# Patient Record
Sex: Male | Born: 1995 | Race: Black or African American | Hispanic: No | Marital: Single | Smoking: Never smoker
Health system: Southern US, Community
[De-identification: ages and names within clinical notes are randomized; demographics above are authoritative.]

## PROBLEM LIST (undated history)

## (undated) DIAGNOSIS — G43909 Migraine, unspecified, not intractable, without status migrainosus: Secondary | ICD-10-CM

## (undated) DIAGNOSIS — A64 Unspecified sexually transmitted disease: Secondary | ICD-10-CM

---

## 2017-01-13 ENCOUNTER — Emergency Department (HOSPITAL_COMMUNITY)
Admission: EM | Admit: 2017-01-13 | Discharge: 2017-01-13 | Disposition: A | Discharge status: Home / Self Care | Payer: Self-pay | Attending: Emergency Medicine | Admitting: Emergency Medicine

## 2017-01-13 ENCOUNTER — Encounter (HOSPITAL_COMMUNITY): Payer: Self-pay | Admitting: Emergency Medicine

## 2017-01-13 DIAGNOSIS — Z202 Contact with and (suspected) exposure to infections with a predominantly sexual mode of transmission: Secondary | ICD-10-CM | POA: Insufficient documentation

## 2017-01-13 HISTORY — DX: Migraine, unspecified, not intractable, without status migrainosus: G43.909

## 2017-01-13 MED ORDER — LIDOCAINE HCL 1 % IJ SOLN
INTRAMUSCULAR | Status: AC
  Administered 2017-01-13: 20 mL
  Filled 2017-01-13: qty 20

## 2017-01-13 MED ORDER — CEFTRIAXONE SODIUM 250 MG IJ SOLR
250.0000 mg | Freq: Once | INTRAMUSCULAR | Status: AC
  Administered 2017-01-13: 250 mg via INTRAMUSCULAR
  Filled 2017-01-13: qty 250

## 2017-01-13 MED ORDER — AZITHROMYCIN 250 MG PO TABS
1000.0000 mg | ORAL_TABLET | Freq: Once | ORAL | Status: AC
  Administered 2017-01-13: 1000 mg via ORAL
  Filled 2017-01-13: qty 4

## 2017-01-13 NOTE — Discharge Instructions (Signed)
Your results will be available in 24-48 hours on MyChart and you will be called with any abnormal results Please do not have sex for the next two weeks and use a condom afterwards

## 2017-01-13 NOTE — ED Triage Notes (Signed)
Pt reports exposure to STD. Pt was informed yesterday. C/o pain on urination x 1 today

## 2017-01-13 NOTE — ED Provider Notes (Signed)
[facility]-EMERGENCY DEPT Provider Note   CSN: [id number] Arrival date & time: 01/13/17  1253  By signing my name below, I, Sonum Patel, attest that this documentation has been prepared under the direction and in the presence of [employer], PA-C. Electronically Signed: Sonum Patel, Neurosurgeon. 01/13/17. 1:56 PM.  History   Chief Complaint Chief Complaint  Patient presents with  . Exposure to STD   The history is provided by the patient. No language interpreter was used.     HPI Comments: Adam Paul is a 21 y.o. male who presents to the Emergency Department requesting STD testing today. He reports having intermittent dysuria yesterday but is unsure if he had any today. He states his last sexual encounter was 2 weeks ago and was unprotected. He states he believes his partner tested positive for something but does not have further information. He denies associated penile drainage, testicular pain.   Past Medical History:  Diagnosis Date  . Migraine     There are no active problems to display for this patient.   History reviewed. No pertinent surgical history.     Home Medications    Prior to Admission medications   Not on File    Family History Family History  Problem Relation Age of Onset  . Diabetes Father     Social History Social History  Substance Use Topics  . Smoking status: Never Smoker  . Smokeless tobacco: Never Used  . Alcohol use Yes     Comment: occ     Allergies   Patient has no known allergies.   Review of Systems Review of Systems  Constitutional: Negative for fever.  Genitourinary: Positive for dysuria. Negative for discharge and testicular pain.     Physical Exam Updated Vital Signs BP 138/69 (BP Location: Left Arm)   Pulse 88   Temp 98.1 F (36.7 C) (Oral)   Resp 18   Wt 238 lb (108 kg)   SpO2 96%   Physical Exam  Constitutional: He is oriented to person, place, and time. He appears well-developed and well-nourished.  HENT:  Head:  Normocephalic and atraumatic.  Cardiovascular: Normal rate.   Pulmonary/Chest: Effort normal.  Genitourinary:  Genitourinary Comments: No inguinal lymphadenopathy or inguinal hernia noted. Normal circumcised penis free of lesions or rash. Testicles are nontender with normal lie. Normal scrotal appearance. No obvious discharge noted. Chaperone present during exam.   Neurological: He is alert and oriented to person, place, and time.  Skin: Skin is warm and dry.  Psychiatric: He has a normal mood and affect.  Nursing note and vitals reviewed.    ED Treatments / Results  DIAGNOSTIC STUDIES: Oxygen Saturation is 96% on RA, adequate by my interpretation.    COORDINATION OF CARE: 1:54 PM Discussed treatment plan with pt at bedside and pt agreed to plan.   Labs (all labs ordered are listed, but only abnormal results are displayed) Labs Reviewed - No data to display  EKG  EKG Interpretation None       Radiology No results found.  Procedures Procedures (including critical care time)  Medications Ordered in ED Medications  cefTRIAXone (ROCEPHIN) injection 250 mg (250 mg Intramuscular Given 01/13/17 1420)  azithromycin (ZITHROMAX) tablet 1,000 mg (1,000 mg Oral Given 01/13/17 1420)  lidocaine (XYLOCAINE) 1 % (with pres) injection (20 mLs  Given 01/13/17 1423)     Initial Impression / Assessment and Plan / ED Course  I have reviewed the triage vital signs and the nursing notes.  Pertinent labs &  imaging results that were available during my care of the patient were reviewed by me and considered in my medical decision making (see chart for details).  Pt arrives for asymptomatic STD check. G&C culture sent. Due to exposure, will treated for G&C. Discussed safe sexual practices. Pt is advised to follow up for free testing at local health department in the future. Pt appears safe for discharge.    Final Clinical Impressions(s) / ED Diagnoses   Final diagnoses:  STD exposure    New  Prescriptions New Prescriptions   No medications on file   I personally performed the services described in this documentation, which was scribed in my presence. The recorded information has been reviewed and is accurate.    Bethel BornKelly Marie Asia Favata, PA-C 01/13/17 1753    Laurence Spatesachel Morgan Little, MD 01/15/17 2032

## 2017-01-14 LAB — GC/CHLAMYDIA PROBE AMP (~~LOC~~) NOT AT ARMC
CHLAMYDIA, DNA PROBE: NEGATIVE
Neisseria Gonorrhea: NEGATIVE

## 2017-02-06 ENCOUNTER — Encounter (HOSPITAL_COMMUNITY): Payer: Self-pay | Admitting: Emergency Medicine

## 2017-02-06 ENCOUNTER — Emergency Department (HOSPITAL_COMMUNITY): Payer: Self-pay

## 2017-02-06 ENCOUNTER — Emergency Department (HOSPITAL_COMMUNITY)
Admission: EM | Admit: 2017-02-06 | Discharge: 2017-02-06 | Disposition: A | Discharge status: Home / Self Care | Payer: Self-pay | Attending: Emergency Medicine | Admitting: Emergency Medicine

## 2017-02-06 DIAGNOSIS — Y9361 Activity, american tackle football: Secondary | ICD-10-CM | POA: Insufficient documentation

## 2017-02-06 DIAGNOSIS — W2101XA Struck by football, initial encounter: Secondary | ICD-10-CM | POA: Insufficient documentation

## 2017-02-06 DIAGNOSIS — Y999 Unspecified external cause status: Secondary | ICD-10-CM | POA: Insufficient documentation

## 2017-02-06 DIAGNOSIS — S4992XA Unspecified injury of left shoulder and upper arm, initial encounter: Secondary | ICD-10-CM | POA: Insufficient documentation

## 2017-02-06 DIAGNOSIS — W1830XA Fall on same level, unspecified, initial encounter: Secondary | ICD-10-CM | POA: Insufficient documentation

## 2017-02-06 DIAGNOSIS — Y929 Unspecified place or not applicable: Secondary | ICD-10-CM | POA: Insufficient documentation

## 2017-02-06 DIAGNOSIS — Z79899 Other long term (current) drug therapy: Secondary | ICD-10-CM | POA: Insufficient documentation

## 2017-02-06 HISTORY — DX: Unspecified sexually transmitted disease: A64

## 2017-02-06 MED ORDER — METHOCARBAMOL 500 MG PO TABS: 500.0000 mg | ORAL_TABLET | Freq: Two times a day (BID) | ORAL | 0 refills | Status: AC

## 2017-02-06 MED ORDER — KETOROLAC TROMETHAMINE 60 MG/2ML IM SOLN
60.0000 mg | Freq: Once | INTRAMUSCULAR | Status: AC
  Administered 2017-02-06: 60 mg via INTRAMUSCULAR
  Filled 2017-02-06: qty 2

## 2017-02-06 MED ORDER — IBUPROFEN 800 MG PO TABS: 800.0000 mg | ORAL_TABLET | Freq: Three times a day (TID) | ORAL | 0 refills | Status: AC

## 2017-02-06 NOTE — Discharge Instructions (Signed)
Your xray showed no acute abnormalities. May take ibuprofen or naproxen to reduce pain and inflammation. Robaxin is a muscle relaxer and can be used to loosen stiff or spasming muscles. Do not take Robaxin while driving or performing other dangerous activities. Use the sling as needed for comfort. Follow-up with the orthopedic surgeon, as planned, tomorrow.

## 2017-02-06 NOTE — ED Triage Notes (Signed)
Pt has pain in both shoulder since yesterday. Pt was in football practice , Pt reports that pain in l/shoulder hurts worse

## 2017-02-06 NOTE — ED Provider Notes (Signed)
[facility]-EMERGENCY DEPT Provider Note   CSN: [id number] Arrival date & time: 02/06/17  1149 By signing my name below, I, Levon HedgerElizabeth Hall, attest that this documentation has been prepared under the direction and in the presence of non-physician practitioner, Harolyn RutherfordShawn Joy, PA-C. Electronically Signed: Levon HedgerElizabeth Hall, Scribe. 02/06/2017. 1:13 PM.   History   Chief Complaint Chief Complaint  Patient presents with  . Shoulder Injury    l/shoulder   HPI Adam Paul is a 21 y.o. male who presents to the Emergency Department complaining of sudden onset, progressively worsening left shoulder pain onset yesterday s/p mechanical fall. Pt states he yesterday during football practice he collided with another player and noted shoulder pain after getting up.  He then applied ice to the area and took Tylenol with mild relief of pain. This morning, pt states his pain was severe. He currently describes his left shoulder pain as 10/10, constant, throbbing pain that is exacerbated by movement. He notes associated right shoulder pain which he states is mild to moderate in severity and states that it "just feels sore." Denies neuro deficit, neck/back pain, LOC, or any other complaints.  Patient has an orthopedic surgeon he has seen in the past. He sees Dr. Jenness CornerBoartang with [facility] in [place].  The history is provided by the patient. No language interpreter was used.   Past Medical History:  Diagnosis Date  . Migraine   . STD (male)    There are no active problems to display for this patient.   History reviewed. No pertinent surgical history.   Home Medications    Prior to Admission medications   Medication Sig Start Date End Date Taking? Authorizing Provider  ibuprofen (ADVIL,MOTRIN) 800 MG tablet Take 1 tablet (800 mg total) by mouth 3 (three) times daily. 02/06/17   Shawn C Joy, PA-C  methocarbamol (ROBAXIN) 500 MG tablet Take 1 tablet (500 mg total) by mouth 2 (two) times daily. 02/06/17   Anselm PancoastShawn C Joy, PA-C     Family History Family History  Problem Relation Age of Onset  . Diabetes Father     Social History Social History  Substance Use Topics  . Smoking status: Never Smoker  . Smokeless tobacco: Never Used  . Alcohol use Yes     Comment: occ    Allergies   Patient has no known allergies.   Review of Systems Review of Systems  Cardiovascular: Negative for chest pain.  Gastrointestinal: Negative for nausea and vomiting.  Musculoskeletal: Positive for arthralgias. Negative for joint swelling.  Neurological: Negative for weakness and numbness.   Physical Exam Updated Vital Signs BP 118/75 (BP Location: Right Arm)   Pulse 86   Temp 98.3 F (36.8 C) (Oral)   Resp 18   Wt 110.2 kg   SpO2 97%   Physical Exam  Constitutional: He appears well-developed and well-nourished. No distress.  HENT:  Head: Normocephalic and atraumatic.  Eyes: Conjunctivae are normal.  Neck: Neck supple.  Cardiovascular: Normal rate, regular rhythm and intact distal pulses.   Pulmonary/Chest: Effort normal.  Musculoskeletal: He exhibits tenderness.  Tenderness to anterior left shoulder. Limited active ROM to left shoulder due to pain. Passive ROM is more intact with abduction limited to 90 degrees. Empty can test elicits pain in anterior shoulder. No noted crepitus, instability, or deformity.  Neurological: He is alert.  Equal grip strengths, no sensory deficits. Strength in the bilateral upper extremities is 5/5.  Skin: Skin is warm and dry. Capillary refill takes less than 2 seconds. He is  not diaphoretic.  Psychiatric: He has a normal mood and affect. His behavior is normal.  Nursing note and vitals reviewed.  ED Treatments / Results  DIAGNOSTIC STUDIES:  Oxygen Saturation is 96% on RA, normal by my interpretation.    COORDINATION OF CARE:  1:10 PM Discussed treatment plan with pt at bedside and pt agreed to plan.   Labs (all labs ordered are listed, but only abnormal results are  displayed) Labs Reviewed - No data to display  EKG  EKG Interpretation None      Radiology Dg Shoulder Left  Result Date: 02/06/2017 CLINICAL DATA:  Acute left shoulder pain following football injury yesterday. Initial encounter. EXAM: LEFT SHOULDER - 2+ VIEW COMPARISON:  None. FINDINGS: No acute fracture or dislocation noted. No focal bony lesions are identified. No other abnormalities identified. IMPRESSION: No acute bony abnormality. Electronically Signed   By: Harmon Pier M.D.   On: 02/06/2017 13:58    Procedures Procedures (including critical care time)  Medications Ordered in ED Medications  ketorolac (TORADOL) injection 60 mg (60 mg Intramuscular Given 02/06/17 1314)     Initial Impression / Assessment and Plan / ED Course  I have reviewed the triage vital signs and the nursing notes.  Pertinent labs & imaging results that were available during my care of the patient were reviewed by me and considered in my medical decision making (see chart for details).      Patient presents with left shoulder injury. No acute abnormalities on x-ray. Before his xrays returned, patient tells me that he set up an appointment for tomorrow morning. Patient placed in a sling and instructed to keep his appointment tomorrow morning. Further home care and return precautions discussed. Patient voices understanding of all instructions and is comfortable with discharge.  Vitals:   02/06/17 1159 02/06/17 1201 02/06/17 1412  BP: 118/75    Pulse: 98  86  Resp: 18    Temp: 98.3 F (36.8 C)    TempSrc: Oral    SpO2: 96%  97%  Weight:  110.2 kg      Final Clinical Impressions(s) / ED Diagnoses   Final diagnoses:  Injury of left shoulder, initial encounter    New Prescriptions Discharge Medication List as of 02/06/2017  2:14 PM    START taking these medications   Details  ibuprofen (ADVIL,MOTRIN) 800 MG tablet Take 1 tablet (800 mg total) by mouth 3 (three) times daily., Starting Sun  02/06/2017, Print    methocarbamol (ROBAXIN) 500 MG tablet Take 1 tablet (500 mg total) by mouth 2 (two) times daily., Starting Sun 02/06/2017, Print       I personally performed the services described in this documentation, which was scribed in my presence. The recorded information has been reviewed and is accurate.    Anselm Pancoast, PA-C 02/07/17 1845    Charlynne Pander, MD 02/09/17 [phone]

## 2018-09-11 IMAGING — DX DG SHOULDER 2+V*L*
2 series · 2 of 2 positions shown · non-contrast
Comparison: None.

CLINICAL DATA: Acute left shoulder pain following football injury
yesterday. Initial encounter.

EXAM:
LEFT SHOULDER - 2+ VIEW

[shoulder ap]
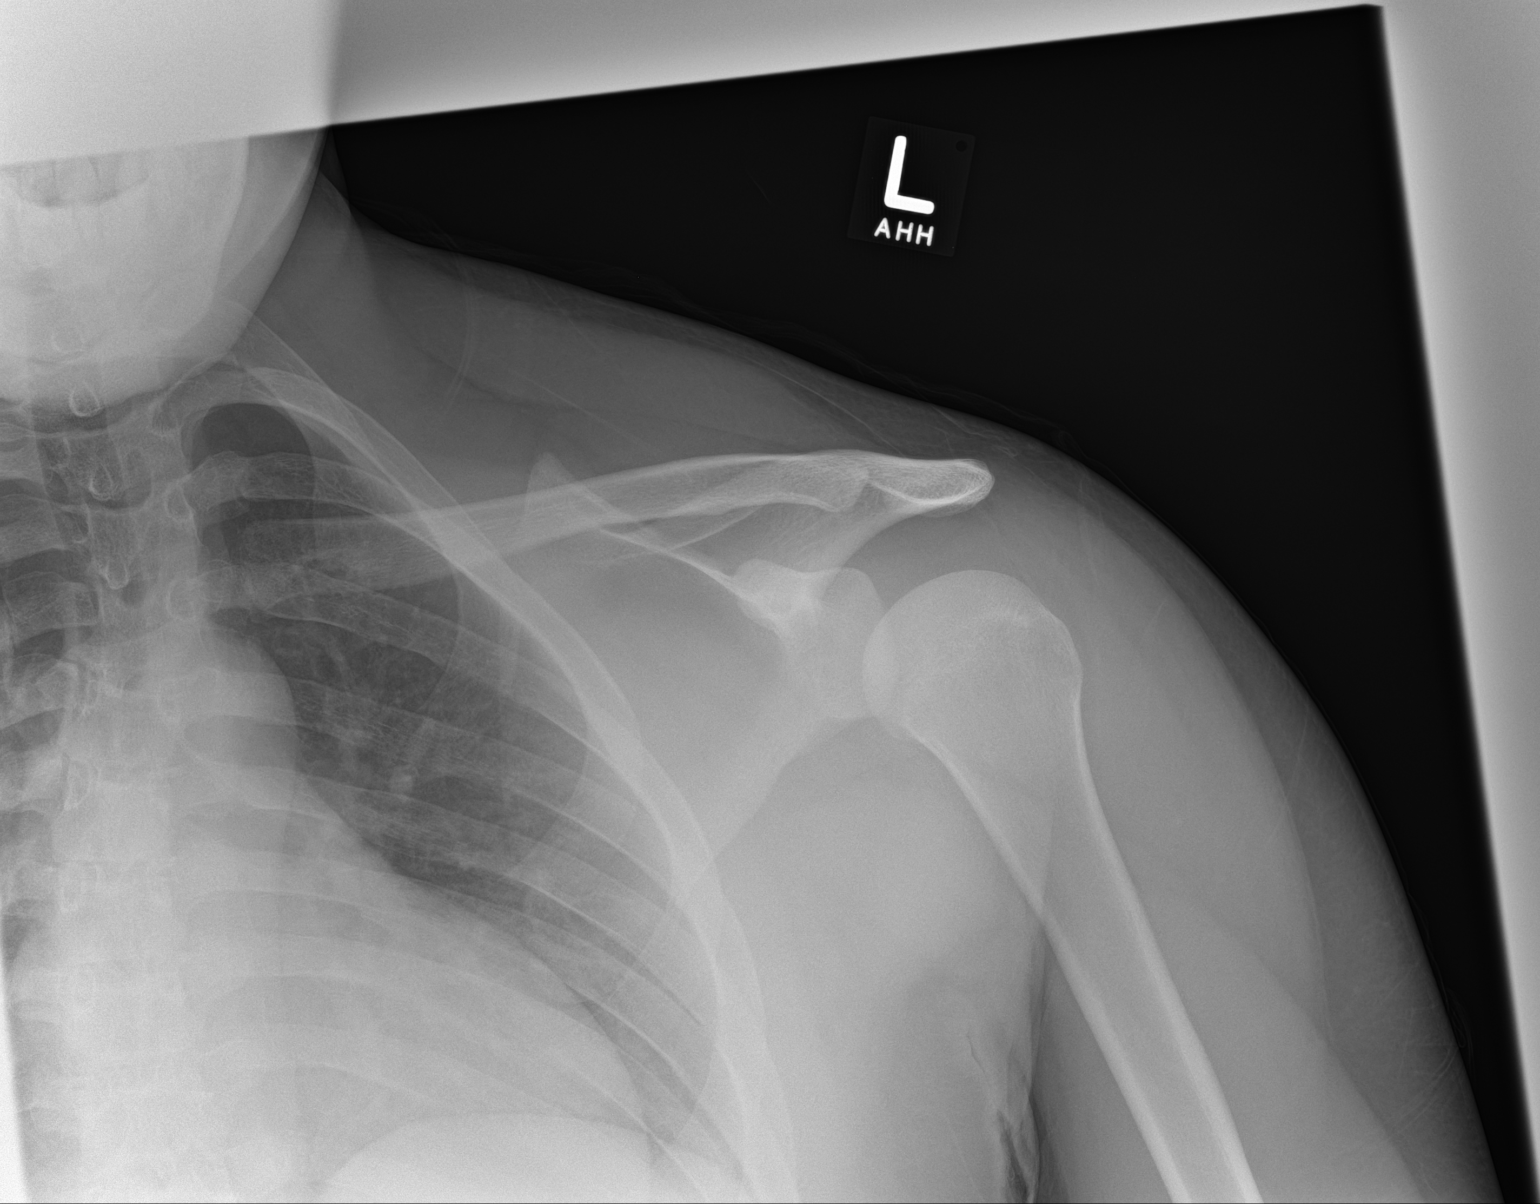

[shoulder y-view]
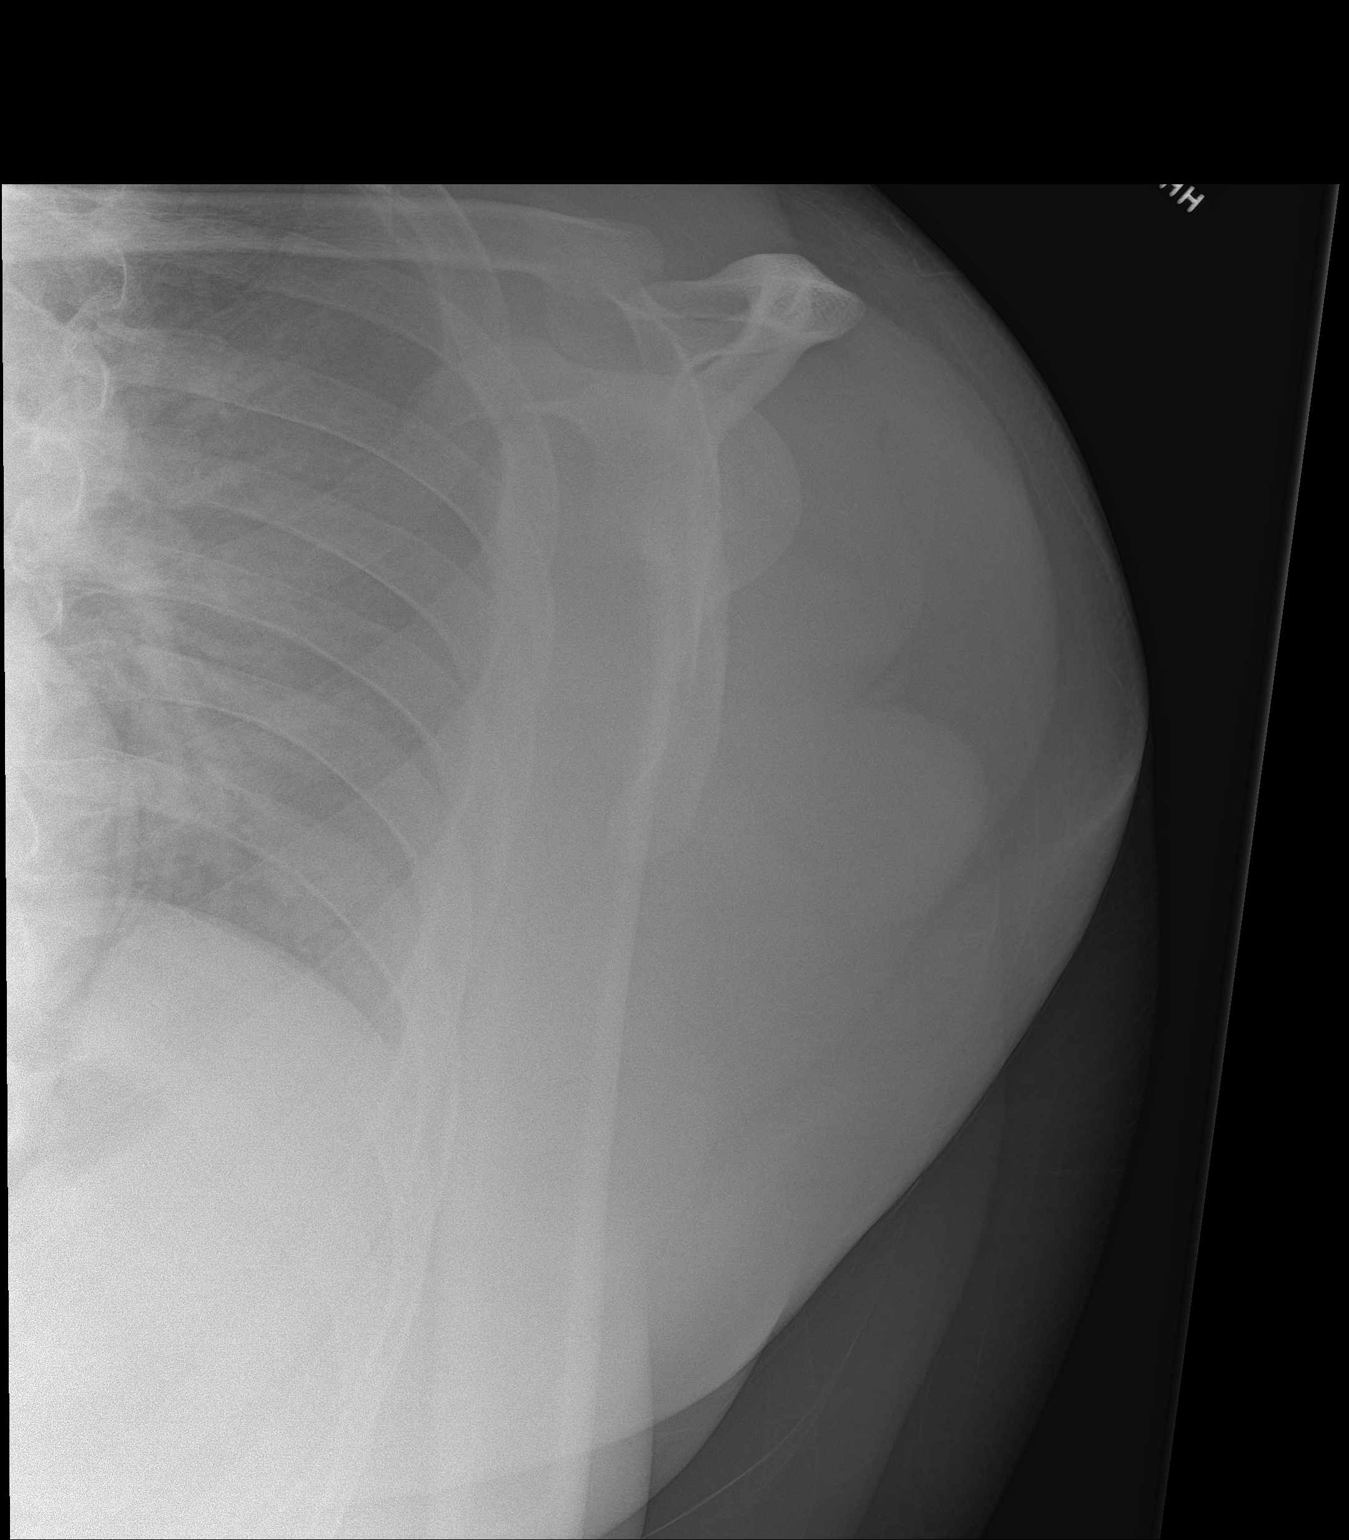

[2 of 2 positions shown; findings below may reference images not displayed]

FINDINGS: No acute fracture or dislocation noted.

No focal bony lesions are identified.

No other abnormalities identified.
IMPRESSION: No acute bony abnormality.
